# Patient Record
Sex: Female | Born: 1975 | Race: White | Hispanic: No | Marital: Married | Smoking: Never smoker
Health system: Southern US, Community
[De-identification: ages and names within clinical notes are randomized; demographics above are authoritative.]

## PROBLEM LIST (undated history)

## (undated) DIAGNOSIS — K900 Celiac disease: Secondary | ICD-10-CM

## (undated) HISTORY — PX: NO PAST SURGERIES: SHX2092

---

## 2010-04-29 ENCOUNTER — Inpatient Hospital Stay (HOSPITAL_COMMUNITY): Admission: EM | Admit: 2010-04-29 | Discharge: 2010-05-01 | Payer: Self-pay | Admitting: Emergency Medicine

## 2010-04-29 ENCOUNTER — Ambulatory Visit: Payer: Self-pay | Admitting: Infectious Diseases

## 2010-11-06 LAB — URINE MICROSCOPIC-ADD ON

## 2010-11-06 LAB — CLOSTRIDIUM DIFFICILE EIA

## 2010-11-06 LAB — COMPREHENSIVE METABOLIC PANEL
ALT: 18 U/L (ref 0–35)
AST: 17 U/L (ref 0–37)
Alkaline Phosphatase: 49 U/L (ref 39–117)
CO2: 26 mEq/L (ref 19–32)
Calcium: 8 mg/dL — ABNORMAL LOW (ref 8.4–10.5)
Chloride: 107 mEq/L (ref 96–112)
GFR calc Af Amer: >60 mL/min (ref >60)
GFR calc non Af Amer: >60 mL/min (ref >60)
Glucose, Bld: 103 mg/dL — ABNORMAL HIGH (ref 70–99)
Potassium: 3.8 mEq/L (ref 3.5–5.1)
Sodium: 136 mEq/L (ref 135–145)
Total Bilirubin: 0.2 mg/dL — ABNORMAL LOW (ref 0.3–1.2)

## 2010-11-06 LAB — BASIC METABOLIC PANEL
BUN: 3 mg/dL — ABNORMAL LOW (ref 6–23)
CO2: 25 mEq/L (ref 19–32)
Calcium: 7.9 mg/dL — ABNORMAL LOW (ref 8.4–10.5)
Chloride: 107 mEq/L (ref 96–112)
Chloride: 108 mEq/L (ref 96–112)
Creatinine, Ser: 0.92 mg/dL (ref 0.4–1.2)
Creatinine, Ser: 1.04 mg/dL (ref 0.4–1.2)
GFR calc Af Amer: >60 mL/min (ref >60)
Sodium: 137 mEq/L (ref 135–145)

## 2010-11-06 LAB — DIFFERENTIAL
Basophils Relative: 0 % (ref 0–1)
Eosinophils Absolute: 0 10*3/uL (ref 0.0–0.7)
Eosinophils Relative: 0 % (ref 0–5)
Lymphocytes Relative: 18 % (ref 12–46)
Lymphs Abs: 0.5 10*3/uL — ABNORMAL LOW (ref 0.7–4.0)
Lymphs Abs: 1 10*3/uL (ref 0.7–4.0)
Neutrophils Relative %: 74 % (ref 43–77)

## 2010-11-06 LAB — GRAM STAIN: Gram Stain: NONE SEEN

## 2010-11-06 LAB — CBC
HCT: 36.7 % (ref 36.0–46.0)
Hemoglobin: 12.7 g/dL (ref 12.0–15.0)
MCH: 31.2 pg (ref 26.0–34.0)
MCV: 90 fL (ref 78.0–100.0)
MCV: 91.2 fL (ref 78.0–100.0)
Platelets: 138 10*3/uL — ABNORMAL LOW (ref 150–400)
Platelets: 144 10*3/uL — ABNORMAL LOW (ref 150–400)
RBC: 3.91 MIL/uL (ref 3.87–5.11)
RBC: 3.98 MIL/uL (ref 3.87–5.11)
RBC: 4.04 MIL/uL (ref 3.87–5.11)
RDW: 12.1 % (ref 11.5–15.5)
WBC: 5.7 10*3/uL (ref 4.0–10.5)

## 2010-11-06 LAB — URINALYSIS, ROUTINE W REFLEX MICROSCOPIC
Glucose, UA: NEGATIVE mg/dL
Ketones, ur: 40 mg/dL — AB
Protein, ur: 30 mg/dL — AB
pH: 6 (ref 5.0–8.0)

## 2010-11-06 LAB — STOOL CULTURE

## 2010-11-06 LAB — LIPASE, BLOOD: Lipase: 26 U/L (ref 11–59)

## 2011-04-13 ENCOUNTER — Other Ambulatory Visit (HOSPITAL_COMMUNITY)
Admission: RE | Admit: 2011-04-13 | Discharge: 2011-04-13 | Disposition: A | Discharge status: Home / Self Care | Payer: Managed Care, Other (non HMO) | Source: Ambulatory Visit | Attending: Obstetrics and Gynecology | Admitting: Obstetrics and Gynecology

## 2011-04-13 DIAGNOSIS — Z01419 Encounter for gynecological examination (general) (routine) without abnormal findings: Secondary | ICD-10-CM | POA: Insufficient documentation

## 2012-04-13 ENCOUNTER — Other Ambulatory Visit (HOSPITAL_COMMUNITY)
Admission: RE | Admit: 2012-04-13 | Discharge: 2012-04-13 | Disposition: A | Discharge status: Home / Self Care | Payer: Managed Care, Other (non HMO) | Source: Ambulatory Visit | Attending: Obstetrics and Gynecology | Admitting: Obstetrics and Gynecology

## 2012-04-13 DIAGNOSIS — Z1151 Encounter for screening for human papillomavirus (HPV): Secondary | ICD-10-CM | POA: Insufficient documentation

## 2012-04-13 DIAGNOSIS — Z01419 Encounter for gynecological examination (general) (routine) without abnormal findings: Secondary | ICD-10-CM | POA: Insufficient documentation

## 2014-04-18 ENCOUNTER — Other Ambulatory Visit (HOSPITAL_COMMUNITY)
Admission: RE | Admit: 2014-04-18 | Discharge: 2014-04-18 | Disposition: A | Discharge status: Home / Self Care | Payer: Managed Care, Other (non HMO) | Source: Ambulatory Visit | Attending: Obstetrics & Gynecology | Admitting: Obstetrics & Gynecology

## 2014-04-18 ENCOUNTER — Other Ambulatory Visit: Payer: Self-pay | Admitting: Obstetrics & Gynecology

## 2014-04-18 DIAGNOSIS — Z1151 Encounter for screening for human papillomavirus (HPV): Secondary | ICD-10-CM | POA: Diagnosis present

## 2014-04-18 DIAGNOSIS — Z01419 Encounter for gynecological examination (general) (routine) without abnormal findings: Secondary | ICD-10-CM | POA: Diagnosis present

## 2014-04-20 LAB — CYTOLOGY - PAP

## 2014-11-14 ENCOUNTER — Other Ambulatory Visit: Payer: Self-pay

## 2014-11-14 DIAGNOSIS — Z1231 Encounter for screening mammogram for malignant neoplasm of breast: Secondary | ICD-10-CM

## 2014-12-11 ENCOUNTER — Ambulatory Visit
Admission: RE | Admit: 2014-12-11 | Discharge: 2014-12-11 | Disposition: A | Discharge status: Home / Self Care | Payer: BLUE CROSS/BLUE SHIELD | Source: Ambulatory Visit

## 2014-12-11 DIAGNOSIS — Z1231 Encounter for screening mammogram for malignant neoplasm of breast: Secondary | ICD-10-CM

## 2016-10-14 DIAGNOSIS — N390 Urinary tract infection, site not specified: Secondary | ICD-10-CM | POA: Diagnosis not present

## 2016-11-18 DIAGNOSIS — M9903 Segmental and somatic dysfunction of lumbar region: Secondary | ICD-10-CM | POA: Diagnosis not present

## 2016-11-18 DIAGNOSIS — M9901 Segmental and somatic dysfunction of cervical region: Secondary | ICD-10-CM | POA: Diagnosis not present

## 2016-11-18 DIAGNOSIS — R293 Abnormal posture: Secondary | ICD-10-CM | POA: Diagnosis not present

## 2016-11-18 DIAGNOSIS — M791 Myalgia: Secondary | ICD-10-CM | POA: Diagnosis not present

## 2017-04-27 ENCOUNTER — Other Ambulatory Visit (HOSPITAL_COMMUNITY)
Admission: RE | Admit: 2017-04-27 | Discharge: 2017-04-27 | Disposition: A | Discharge status: Home / Self Care | Payer: BLUE CROSS/BLUE SHIELD | Source: Ambulatory Visit | Attending: Obstetrics and Gynecology | Admitting: Obstetrics and Gynecology

## 2017-04-27 ENCOUNTER — Other Ambulatory Visit: Payer: Self-pay | Admitting: Obstetrics and Gynecology

## 2017-04-27 DIAGNOSIS — Z124 Encounter for screening for malignant neoplasm of cervix: Secondary | ICD-10-CM | POA: Diagnosis not present

## 2017-04-27 DIAGNOSIS — Z01411 Encounter for gynecological examination (general) (routine) with abnormal findings: Secondary | ICD-10-CM | POA: Diagnosis not present

## 2017-04-27 DIAGNOSIS — Z30431 Encounter for routine checking of intrauterine contraceptive device: Secondary | ICD-10-CM | POA: Diagnosis not present

## 2017-04-29 LAB — CYTOLOGY - PAP
Diagnosis: NEGATIVE
HPV (WINDOPATH): NOT DETECTED

## 2017-05-11 DIAGNOSIS — Z30433 Encounter for removal and reinsertion of intrauterine contraceptive device: Secondary | ICD-10-CM | POA: Diagnosis not present

## 2017-06-22 DIAGNOSIS — Z30431 Encounter for routine checking of intrauterine contraceptive device: Secondary | ICD-10-CM | POA: Diagnosis not present

## 2017-11-19 ENCOUNTER — Other Ambulatory Visit: Payer: Self-pay | Admitting: Obstetrics and Gynecology

## 2017-11-19 DIAGNOSIS — Z1231 Encounter for screening mammogram for malignant neoplasm of breast: Secondary | ICD-10-CM

## 2017-12-10 ENCOUNTER — Ambulatory Visit
Admission: RE | Admit: 2017-12-10 | Discharge: 2017-12-10 | Disposition: A | Discharge status: Home / Self Care | Payer: BLUE CROSS/BLUE SHIELD | Source: Ambulatory Visit | Attending: Obstetrics and Gynecology | Admitting: Obstetrics and Gynecology

## 2017-12-10 DIAGNOSIS — Z1231 Encounter for screening mammogram for malignant neoplasm of breast: Secondary | ICD-10-CM | POA: Diagnosis not present

## 2018-05-25 DIAGNOSIS — H6092 Unspecified otitis externa, left ear: Secondary | ICD-10-CM | POA: Diagnosis not present

## 2018-05-27 DIAGNOSIS — Z01419 Encounter for gynecological examination (general) (routine) without abnormal findings: Secondary | ICD-10-CM | POA: Diagnosis not present

## 2019-05-15 DIAGNOSIS — R197 Diarrhea, unspecified: Secondary | ICD-10-CM | POA: Diagnosis not present

## 2019-05-19 DIAGNOSIS — R197 Diarrhea, unspecified: Secondary | ICD-10-CM | POA: Diagnosis not present

## 2019-05-19 DIAGNOSIS — Z23 Encounter for immunization: Secondary | ICD-10-CM | POA: Diagnosis not present

## 2019-05-31 DIAGNOSIS — Z01419 Encounter for gynecological examination (general) (routine) without abnormal findings: Secondary | ICD-10-CM | POA: Diagnosis not present

## 2019-07-05 DIAGNOSIS — K529 Noninfective gastroenteritis and colitis, unspecified: Secondary | ICD-10-CM | POA: Diagnosis not present

## 2019-07-05 DIAGNOSIS — R195 Other fecal abnormalities: Secondary | ICD-10-CM | POA: Diagnosis not present

## 2019-07-05 DIAGNOSIS — R198 Other specified symptoms and signs involving the digestive system and abdomen: Secondary | ICD-10-CM | POA: Diagnosis not present

## 2019-07-07 DIAGNOSIS — K529 Noninfective gastroenteritis and colitis, unspecified: Secondary | ICD-10-CM | POA: Diagnosis not present

## 2019-07-10 DIAGNOSIS — K529 Noninfective gastroenteritis and colitis, unspecified: Secondary | ICD-10-CM | POA: Diagnosis not present

## 2019-07-25 DIAGNOSIS — Z1159 Encounter for screening for other viral diseases: Secondary | ICD-10-CM | POA: Diagnosis not present

## 2019-07-28 DIAGNOSIS — R768 Other specified abnormal immunological findings in serum: Secondary | ICD-10-CM | POA: Diagnosis not present

## 2019-07-28 DIAGNOSIS — K900 Celiac disease: Secondary | ICD-10-CM | POA: Diagnosis not present

## 2019-07-28 DIAGNOSIS — K3189 Other diseases of stomach and duodenum: Secondary | ICD-10-CM | POA: Diagnosis not present

## 2019-07-28 DIAGNOSIS — R194 Change in bowel habit: Secondary | ICD-10-CM | POA: Diagnosis not present

## 2019-07-28 DIAGNOSIS — K2930 Chronic superficial gastritis without bleeding: Secondary | ICD-10-CM | POA: Diagnosis not present

## 2019-07-28 DIAGNOSIS — R197 Diarrhea, unspecified: Secondary | ICD-10-CM | POA: Diagnosis not present

## 2019-08-24 DIAGNOSIS — K900 Celiac disease: Secondary | ICD-10-CM | POA: Diagnosis not present

## 2019-08-25 DIAGNOSIS — Z20828 Contact with and (suspected) exposure to other viral communicable diseases: Secondary | ICD-10-CM | POA: Diagnosis not present

## 2019-08-31 DIAGNOSIS — K900 Celiac disease: Secondary | ICD-10-CM | POA: Diagnosis not present

## 2019-09-04 ENCOUNTER — Encounter: Payer: BC Managed Care – PPO | Attending: Gastroenterology | Admitting: Registered"

## 2019-09-04 ENCOUNTER — Other Ambulatory Visit: Payer: Self-pay

## 2019-09-04 ENCOUNTER — Encounter: Payer: Self-pay | Admitting: Registered"

## 2019-09-04 DIAGNOSIS — Z713 Dietary counseling and surveillance: Secondary | ICD-10-CM

## 2019-09-04 NOTE — Progress Notes (Signed)
  Medical Nutrition Therapy:  Appt start time: 0815 end time:  0845.   Assessment:  Primary concerns today:  Patient states she was recently diagnoses with Celiac Disease. Pt reports since diagnosis she has been able to cut out gluten and CD related symptoms have improved. Pt states her mother is a retired Data processing manager and has helped her. The main reason for visit is her concern of cross-contamination.  Pt reports resources used include Gluten-free scanner app and Celiac [employer].  BM: diarrhea has resolved on GF diet. Sleep: 7-8 hrs. Sleeps ~11:30p-7a; wakes up refreshed.  Seasonally has post nasal drip with sore throat in morning.  Learning Readiness:   Change in progress   MEDICATIONS: reviewed   DIETARY INTAKE:  Usual eating pattern includes 3 meals and 2 snacks per day.  Avoided foods include gluten.    24-hr recall:  B (11 AM): M-F 30 gram pro powder, collagen, 1/2 c GF oatmeal Snk ( AM): none  L ( PM): M-F 4 oz chicken, honey mustard Snk ( PM): apple & PB OR other fruit D ( PM): Pro (fish, chicken, scallops), vegetables, starch OR soups Snk ( PM): chocolate OR protein bar Beverages: water, hot tea, almond milk with breakfast, OJ on weekends  Usual physical activity: 3-5x week running 3-5 miles; weight lifting (has a full gym at home)  Estimated energy needs: ~1800 kcal/day  Progress Towards Goal(s):  In progress.   Nutritional Diagnosis:  NB-1.1 Food and nutrition-related knowledge deficit As related to gluten cross-contamination .  As evidenced by Pt reported desire for increased knowledge.    Intervention:  Nutrition Education. Discussed when cross-contamination may be a concern with home cooking, shopping for products, and dining out.  Teaching Method Utilized: Visual Auditory  Handouts given during visit include:  none  Barriers to learning/adherence to lifestyle change: none  Demonstrated degree of understanding via:  Teach Back    Monitoring/Evaluation:  Dietary intake, exercise, CD flares, and body weight prn.

## 2019-09-04 NOTE — Patient Instructions (Addendum)
To address your cross-contamination concerns: Get your own [employer]  storage containers (if plastic) At restaurants ask if they separate GF foods

## 2019-10-28 ENCOUNTER — Encounter (HOSPITAL_COMMUNITY): Payer: Self-pay | Admitting: *Deleted

## 2019-10-28 ENCOUNTER — Ambulatory Visit (HOSPITAL_COMMUNITY)
Admission: EM | Admit: 2019-10-28 | Discharge: 2019-10-28 | Disposition: A | Discharge status: Home / Self Care | Payer: BC Managed Care – PPO

## 2019-10-28 ENCOUNTER — Other Ambulatory Visit: Payer: Self-pay

## 2019-10-28 DIAGNOSIS — M79675 Pain in left toe(s): Secondary | ICD-10-CM

## 2019-10-28 DIAGNOSIS — Z23 Encounter for immunization: Secondary | ICD-10-CM | POA: Diagnosis not present

## 2019-10-28 DIAGNOSIS — W293XXA Contact with powered garden and outdoor hand tools and machinery, initial encounter: Secondary | ICD-10-CM

## 2019-10-28 DIAGNOSIS — S91212A Laceration without foreign body of left great toe with damage to nail, initial encounter: Secondary | ICD-10-CM | POA: Diagnosis not present

## 2019-10-28 HISTORY — DX: Celiac disease: K90.0

## 2019-10-28 MED ORDER — TETANUS-DIPHTH-ACELL PERTUSSIS 5-2.5-18.5 LF-MCG/0.5 IM SUSP
0.5000 mL | Freq: Once | INTRAMUSCULAR | Status: AC
  Administered 2019-10-28: 0.5 mL via INTRAMUSCULAR

## 2019-10-28 MED ORDER — SULFAMETHOXAZOLE-TRIMETHOPRIM 800-160 MG PO TABS: 1.0000 | ORAL_TABLET | Freq: Two times a day (BID) | ORAL | 0 refills | Status: AC

## 2019-10-28 MED ORDER — IBUPROFEN 800 MG PO TABS: 800.0000 mg | ORAL_TABLET | Freq: Three times a day (TID) | ORAL | 0 refills | Status: AC | PRN

## 2019-10-28 MED ORDER — TETANUS-DIPHTH-ACELL PERTUSSIS 5-2.5-18.5 LF-MCG/0.5 IM SUSP
INTRAMUSCULAR | Status: AC
  Filled 2019-10-28: qty 0.5

## 2019-10-28 MED ORDER — TRAMADOL HCL 50 MG PO TABS: 50.0000 mg | ORAL_TABLET | Freq: Four times a day (QID) | ORAL | 0 refills | Status: DC | PRN

## 2019-10-28 NOTE — ED Triage Notes (Signed)
Reports using chainsaw to cut log when it got stuck and pulled out, cutting through left shoe.  Macerated wound to left great toe nailbed with superficial-appearing laceration to medial left great toe extending some into distal foot.  LLE CMS intact.  Bleeding controlled.

## 2019-10-28 NOTE — ED Notes (Signed)
Staff did an assessment on patient foot, pt is able to move her left foot. There is no numbness or tingling just throbbing. There is no white meat hanging out. The chainsaw cut her show first then skin

## 2019-10-28 NOTE — ED Provider Notes (Signed)
[place]    CSN: [id number] Arrival date & time: 10/28/19  1241      History   Chief Complaint Chief Complaint  Patient presents with  . Extremity Laceration    HPI Christine Escobar is a 44 y.o. female.   Patient reports that she was using a chainsaw earlier today, and that the chainsaw slipped on a knot in the wood that she was cutting.  Then the chainsaw caught her left foot.  Reports that she can move her toes, but that it is painful.  Reports that she was wearing shoes and that it cut through her shoe and sock.  Has not attempted any treatments.  Does not recall when last tetanus vaccine was.  Denies fever, cough, headache, radiating pain anywhere, nausea, vomiting, diarrhea, chills, body aches, rash, other symptoms.     The history is provided by the patient.    Past Medical History:  Diagnosis Date  . Celiac disease     There are no problems to display for this patient.   History reviewed. No pertinent surgical history.  OB History   No obstetric history on file.      Home Medications    Prior to Admission medications   Medication Sig Start Date End Date Taking? Authorizing Provider  Glucos-Chondroit-Collag-Hyal (GLUCOSAMINE CHONDROIT-COLLAGEN PO) Take by mouth.   Yes [provider]  Multiple Vitamins-Minerals (MULTIVITAMIN ADULT PO) Take by mouth.   Yes [provider]  Probiotic Product (PROBIOTIC PO) Take by mouth.   Yes [provider]  VITAMIN D PO Take by mouth.   Yes [provider]  ibuprofen (ADVIL) 800 MG tablet Take 1 tablet (800 mg total) by mouth every 8 (eight) hours as needed for moderate pain. 10/28/19   Faustino Congress, NP  loratadine (CLARITIN) 10 MG tablet Take 10 mg by mouth daily.    [provider]  sulfamethoxazole-trimethoprim (BACTRIM DS) 800-160 MG tablet Take 1 tablet by mouth 2 (two) times daily for 7 days. 10/28/19 11/04/19  Faustino Congress, NP  traMADol (ULTRAM) 50 MG  tablet Take 1 tablet (50 mg total) by mouth every 6 (six) hours as needed. 10/28/19   Faustino Congress, NP    Family History Family History  Problem Relation Age of Onset  . Hypertension Father     Social History Social History   Tobacco Use  . Smoking status: Never Smoker  . Smokeless tobacco: Never Used  Substance Use Topics  . Alcohol use: Yes    Comment: rarely  . Drug use: Never     Allergies   Patient has no known allergies.   Review of Systems Review of Systems   Physical Exam Triage Vital Signs ED Triage Vitals  Enc Vitals Group     BP 10/28/19 1319 112/73     Pulse Rate 10/28/19 1319 87     Resp 10/28/19 1319 16     Temp 10/28/19 1319 98.2 F (36.8 C)     Temp Source 10/28/19 1319 Oral     SpO2 10/28/19 1319 97 %     Weight --      Height --      Head Circumference --      Peak Flow --      Pain Score 10/28/19 1322 8     Pain Loc --      Pain Edu? --      Excl. in [place]? --    No data found.  Updated Vital Signs BP 112/73  Pulse 87   Temp 98.2 F (36.8 C) (Oral)   Resp 16   SpO2 97%      Physical Exam Vitals and nursing note reviewed.  Constitutional:      General: She is not in acute distress.    Appearance: Normal appearance. She is well-developed and normal weight.  HENT:     Head: Normocephalic and atraumatic.  Eyes:     Conjunctiva/sclera: Conjunctivae normal.  Cardiovascular:     Rate and Rhythm: Normal rate and regular rhythm.     Heart sounds: Normal heart sounds. No murmur.  Pulmonary:     Effort: Pulmonary effort is normal. No respiratory distress.     Breath sounds: Normal breath sounds. No stridor. No wheezing, rhonchi or rales.  Abdominal:     General: Bowel sounds are normal.     Palpations: Abdomen is soft.     Tenderness: There is no abdominal tenderness.  Musculoskeletal:        General: Signs of injury present.     Cervical back: Neck supple.     Left foot: Laceration present.       Legs:     Comments: 1  inch lac to great toe.  Laceration to toenail as well, no wound edges to approximate.    Skin:    General: Skin is warm and dry.     Capillary Refill: Capillary refill takes less than 2 seconds.  Neurological:     General: No focal deficit present.     Mental Status: She is alert and oriented to person, place, and time.  Psychiatric:        Mood and Affect: Mood normal.        Behavior: Behavior normal.      UC Treatments / Results  Labs (all labs ordered are listed, but only abnormal results are displayed) Labs Reviewed - No data to display  EKG   Radiology No results found.  Procedures Laceration Repair  Date/Time: 10/28/2019 3:54 PM Performed by: Moshe Cipro, NP Authorized by: Moshe Cipro, NP   Consent:    Consent obtained:  Verbal   Consent given by:  Patient   Risks discussed:  Infection and pain Universal protocol:    Patient identity confirmed:  Verbally with patient Anesthesia (see MAR for exact dosages):    Anesthesia method:  Local infiltration   Local anesthetic:  Lidocaine 2% w/o epi Laceration details:    Location:  Toe   Toe location:  L big toe   Length (cm):  1   Depth (mm):  1 Repair type:    Repair type:  Simple Exploration:    Wound exploration: entire depth of wound probed and visualized     Contaminated: no   Treatment:    Area cleansed with:  Betadine   Amount of cleaning:  Standard   Irrigation solution:  Sterile saline   Irrigation volume:  20 mm   Irrigation method:  Syringe   Visualized foreign bodies/material removed: yes   Skin repair:    Repair method:  Tissue adhesive Approximation:    Approximation:  Close Post-procedure details:    Dressing:  Non-adherent dressing   Patient tolerance of procedure:  Tolerated well, no immediate complications Comments:     1 inch laceration to left great toe closed with skin glue.  Laceration to toenail did not have edges to approximate.  Most of left great toenail was  removed.  Nonadherent dressing placed with pressure dressing above wrapped in [place].   (  including critical care time)  Medications Ordered in UC Medications  Tdap (BOOSTRIX) injection 0.5 mL (0.5 mLs Intramuscular Given 10/28/19 1410)    Initial Impression / Assessment and Plan / UC Course  I have reviewed the triage vital signs and the nursing notes.  Pertinent labs & imaging results that were available during my care of the patient were reviewed by me and considered in my medical decision making (see chart for details).     Laceration to great toe no foreign body, pain of left great toe and foot.  Chainsaw injury to left great toe.  Laceration repaired in office.  Unable to repair toenail, pressure dressing applied.  Prescribed ibuprofen 800 mg as needed every 8 hours for pain and inflammation also prescribed tramadol 50 mg 1 tablet by mouth every 6 hours as needed for pain also prescribed Bactrim 1 tablet twice a day x7 days.  Instructed to follow-up with primary care or this office if noticing any signs of infection. Final Clinical Impressions(s) / UC Diagnoses   Final diagnoses:  Laceration of left great toe without foreign body with damage to nail, initial encounter  Pain of left great toe     Discharge Instructions     Take the ibuprofen as prescribed.  Rest and elevate your foot.  Apply ice packs 2-3 times a day for up to 20 minutes each.  Wear the Ace wrap as needed for comfort.    Follow up with your primary care provider or an orthopedist if you symptoms continue or worsen;  Or if you develop new symptoms, such as numbness, tingling, or weakness.       ED Prescriptions    Medication Sig Dispense Auth. Provider   ibuprofen (ADVIL) 800 MG tablet Take 1 tablet (800 mg total) by mouth every 8 (eight) hours as needed for moderate pain. 21 tablet Moshe Cipro, NP   traMADol (ULTRAM) 50 MG tablet Take 1 tablet (50 mg total) by mouth every 6 (six) hours as needed. 15  tablet Moshe Cipro, NP   sulfamethoxazole-trimethoprim (BACTRIM DS) 800-160 MG tablet Take 1 tablet by mouth 2 (two) times daily for 7 days. 14 tablet Moshe Cipro, NP     I have reviewed the PDMP during this encounter.   Moshe Cipro, NP 10/28/19 1558

## 2019-10-28 NOTE — Discharge Instructions (Signed)
Take the ibuprofen as prescribed.  Rest and elevate your foot.  Apply ice packs 2-3 times a day for up to 20 minutes each.  Wear the Ace wrap as needed for comfort.   ° °Follow up with your primary care provider or an orthopedist if you symptoms continue or worsen;  Or if you develop new symptoms, such as numbness, tingling, or weakness.   ° ° ° °

## 2020-04-24 DIAGNOSIS — K900 Celiac disease: Secondary | ICD-10-CM | POA: Diagnosis not present

## 2021-01-14 DIAGNOSIS — Z01419 Encounter for gynecological examination (general) (routine) without abnormal findings: Secondary | ICD-10-CM | POA: Diagnosis not present

## 2021-03-20 ENCOUNTER — Other Ambulatory Visit: Payer: Self-pay | Admitting: Nurse Practitioner

## 2021-03-20 ENCOUNTER — Other Ambulatory Visit: Payer: Self-pay | Admitting: *Deleted

## 2021-03-20 DIAGNOSIS — Z1231 Encounter for screening mammogram for malignant neoplasm of breast: Secondary | ICD-10-CM

## 2021-03-22 ENCOUNTER — Ambulatory Visit
Admission: RE | Admit: 2021-03-22 | Discharge: 2021-03-22 | Disposition: A | Discharge status: Home / Self Care | Payer: BC Managed Care – PPO | Source: Ambulatory Visit | Attending: Nurse Practitioner | Admitting: Nurse Practitioner

## 2021-03-22 DIAGNOSIS — Z1231 Encounter for screening mammogram for malignant neoplasm of breast: Secondary | ICD-10-CM

## 2021-12-15 DIAGNOSIS — K900 Celiac disease: Secondary | ICD-10-CM | POA: Diagnosis not present

## 2021-12-31 DIAGNOSIS — M5388 Other specified dorsopathies, sacral and sacrococcygeal region: Secondary | ICD-10-CM | POA: Diagnosis not present

## 2021-12-31 DIAGNOSIS — G44209 Tension-type headache, unspecified, not intractable: Secondary | ICD-10-CM | POA: Diagnosis not present

## 2021-12-31 DIAGNOSIS — M609 Myositis, unspecified: Secondary | ICD-10-CM | POA: Diagnosis not present

## 2021-12-31 DIAGNOSIS — M47812 Spondylosis without myelopathy or radiculopathy, cervical region: Secondary | ICD-10-CM | POA: Diagnosis not present

## 2022-01-01 DIAGNOSIS — M609 Myositis, unspecified: Secondary | ICD-10-CM | POA: Diagnosis not present

## 2022-01-01 DIAGNOSIS — G44209 Tension-type headache, unspecified, not intractable: Secondary | ICD-10-CM | POA: Diagnosis not present

## 2022-01-01 DIAGNOSIS — M5388 Other specified dorsopathies, sacral and sacrococcygeal region: Secondary | ICD-10-CM | POA: Diagnosis not present

## 2022-01-01 DIAGNOSIS — M47812 Spondylosis without myelopathy or radiculopathy, cervical region: Secondary | ICD-10-CM | POA: Diagnosis not present

## 2022-01-05 DIAGNOSIS — G44209 Tension-type headache, unspecified, not intractable: Secondary | ICD-10-CM | POA: Diagnosis not present

## 2022-01-05 DIAGNOSIS — M5388 Other specified dorsopathies, sacral and sacrococcygeal region: Secondary | ICD-10-CM | POA: Diagnosis not present

## 2022-01-05 DIAGNOSIS — M47812 Spondylosis without myelopathy or radiculopathy, cervical region: Secondary | ICD-10-CM | POA: Diagnosis not present

## 2022-01-05 DIAGNOSIS — M609 Myositis, unspecified: Secondary | ICD-10-CM | POA: Diagnosis not present

## 2022-01-07 DIAGNOSIS — G44209 Tension-type headache, unspecified, not intractable: Secondary | ICD-10-CM | POA: Diagnosis not present

## 2022-01-07 DIAGNOSIS — M47812 Spondylosis without myelopathy or radiculopathy, cervical region: Secondary | ICD-10-CM | POA: Diagnosis not present

## 2022-01-07 DIAGNOSIS — M5388 Other specified dorsopathies, sacral and sacrococcygeal region: Secondary | ICD-10-CM | POA: Diagnosis not present

## 2022-01-07 DIAGNOSIS — M609 Myositis, unspecified: Secondary | ICD-10-CM | POA: Diagnosis not present

## 2022-01-12 DIAGNOSIS — M609 Myositis, unspecified: Secondary | ICD-10-CM | POA: Diagnosis not present

## 2022-01-12 DIAGNOSIS — G44209 Tension-type headache, unspecified, not intractable: Secondary | ICD-10-CM | POA: Diagnosis not present

## 2022-01-12 DIAGNOSIS — M47812 Spondylosis without myelopathy or radiculopathy, cervical region: Secondary | ICD-10-CM | POA: Diagnosis not present

## 2022-01-12 DIAGNOSIS — M5388 Other specified dorsopathies, sacral and sacrococcygeal region: Secondary | ICD-10-CM | POA: Diagnosis not present

## 2022-01-15 ENCOUNTER — Other Ambulatory Visit: Payer: Self-pay | Admitting: Nurse Practitioner

## 2022-01-15 ENCOUNTER — Other Ambulatory Visit (HOSPITAL_COMMUNITY)
Admission: RE | Admit: 2022-01-15 | Discharge: 2022-01-15 | Disposition: A | Discharge status: Home / Self Care | Payer: BC Managed Care – PPO | Source: Ambulatory Visit | Attending: Nurse Practitioner | Admitting: Nurse Practitioner

## 2022-01-15 DIAGNOSIS — G44209 Tension-type headache, unspecified, not intractable: Secondary | ICD-10-CM | POA: Diagnosis not present

## 2022-01-15 DIAGNOSIS — Z124 Encounter for screening for malignant neoplasm of cervix: Secondary | ICD-10-CM | POA: Diagnosis not present

## 2022-01-15 DIAGNOSIS — M5388 Other specified dorsopathies, sacral and sacrococcygeal region: Secondary | ICD-10-CM | POA: Diagnosis not present

## 2022-01-15 DIAGNOSIS — M47812 Spondylosis without myelopathy or radiculopathy, cervical region: Secondary | ICD-10-CM | POA: Diagnosis not present

## 2022-01-15 DIAGNOSIS — M609 Myositis, unspecified: Secondary | ICD-10-CM | POA: Diagnosis not present

## 2022-01-15 DIAGNOSIS — Z01419 Encounter for gynecological examination (general) (routine) without abnormal findings: Secondary | ICD-10-CM | POA: Diagnosis not present

## 2022-01-21 DIAGNOSIS — M47812 Spondylosis without myelopathy or radiculopathy, cervical region: Secondary | ICD-10-CM | POA: Diagnosis not present

## 2022-01-21 DIAGNOSIS — G44209 Tension-type headache, unspecified, not intractable: Secondary | ICD-10-CM | POA: Diagnosis not present

## 2022-01-21 DIAGNOSIS — M5388 Other specified dorsopathies, sacral and sacrococcygeal region: Secondary | ICD-10-CM | POA: Diagnosis not present

## 2022-01-21 DIAGNOSIS — M609 Myositis, unspecified: Secondary | ICD-10-CM | POA: Diagnosis not present

## 2022-01-22 LAB — CYTOLOGY - PAP
Comment: NEGATIVE
Diagnosis: NEGATIVE
High risk HPV: NEGATIVE

## 2022-01-28 DIAGNOSIS — M5388 Other specified dorsopathies, sacral and sacrococcygeal region: Secondary | ICD-10-CM | POA: Diagnosis not present

## 2022-01-28 DIAGNOSIS — M47812 Spondylosis without myelopathy or radiculopathy, cervical region: Secondary | ICD-10-CM | POA: Diagnosis not present

## 2022-01-28 DIAGNOSIS — M609 Myositis, unspecified: Secondary | ICD-10-CM | POA: Diagnosis not present

## 2022-01-28 DIAGNOSIS — G44209 Tension-type headache, unspecified, not intractable: Secondary | ICD-10-CM | POA: Diagnosis not present

## 2022-02-04 DIAGNOSIS — G44209 Tension-type headache, unspecified, not intractable: Secondary | ICD-10-CM | POA: Diagnosis not present

## 2022-02-04 DIAGNOSIS — M5388 Other specified dorsopathies, sacral and sacrococcygeal region: Secondary | ICD-10-CM | POA: Diagnosis not present

## 2022-02-04 DIAGNOSIS — M47812 Spondylosis without myelopathy or radiculopathy, cervical region: Secondary | ICD-10-CM | POA: Diagnosis not present

## 2022-02-04 DIAGNOSIS — M609 Myositis, unspecified: Secondary | ICD-10-CM | POA: Diagnosis not present

## 2022-02-11 DIAGNOSIS — G44209 Tension-type headache, unspecified, not intractable: Secondary | ICD-10-CM | POA: Diagnosis not present

## 2022-02-11 DIAGNOSIS — M5388 Other specified dorsopathies, sacral and sacrococcygeal region: Secondary | ICD-10-CM | POA: Diagnosis not present

## 2022-02-11 DIAGNOSIS — M609 Myositis, unspecified: Secondary | ICD-10-CM | POA: Diagnosis not present

## 2022-02-11 DIAGNOSIS — M47812 Spondylosis without myelopathy or radiculopathy, cervical region: Secondary | ICD-10-CM | POA: Diagnosis not present

## 2022-02-18 DIAGNOSIS — M609 Myositis, unspecified: Secondary | ICD-10-CM | POA: Diagnosis not present

## 2022-02-18 DIAGNOSIS — G44209 Tension-type headache, unspecified, not intractable: Secondary | ICD-10-CM | POA: Diagnosis not present

## 2022-02-18 DIAGNOSIS — M5388 Other specified dorsopathies, sacral and sacrococcygeal region: Secondary | ICD-10-CM | POA: Diagnosis not present

## 2022-02-18 DIAGNOSIS — M47812 Spondylosis without myelopathy or radiculopathy, cervical region: Secondary | ICD-10-CM | POA: Diagnosis not present

## 2022-03-04 DIAGNOSIS — M5388 Other specified dorsopathies, sacral and sacrococcygeal region: Secondary | ICD-10-CM | POA: Diagnosis not present

## 2022-03-04 DIAGNOSIS — M47812 Spondylosis without myelopathy or radiculopathy, cervical region: Secondary | ICD-10-CM | POA: Diagnosis not present

## 2022-03-04 DIAGNOSIS — M609 Myositis, unspecified: Secondary | ICD-10-CM | POA: Diagnosis not present

## 2022-03-04 DIAGNOSIS — G44209 Tension-type headache, unspecified, not intractable: Secondary | ICD-10-CM | POA: Diagnosis not present

## 2022-03-18 DIAGNOSIS — M47812 Spondylosis without myelopathy or radiculopathy, cervical region: Secondary | ICD-10-CM | POA: Diagnosis not present

## 2022-03-18 DIAGNOSIS — M609 Myositis, unspecified: Secondary | ICD-10-CM | POA: Diagnosis not present

## 2022-03-18 DIAGNOSIS — M5388 Other specified dorsopathies, sacral and sacrococcygeal region: Secondary | ICD-10-CM | POA: Diagnosis not present

## 2022-03-18 DIAGNOSIS — G44209 Tension-type headache, unspecified, not intractable: Secondary | ICD-10-CM | POA: Diagnosis not present

## 2022-04-01 DIAGNOSIS — G44209 Tension-type headache, unspecified, not intractable: Secondary | ICD-10-CM | POA: Diagnosis not present

## 2022-04-01 DIAGNOSIS — M47812 Spondylosis without myelopathy or radiculopathy, cervical region: Secondary | ICD-10-CM | POA: Diagnosis not present

## 2022-04-01 DIAGNOSIS — M5388 Other specified dorsopathies, sacral and sacrococcygeal region: Secondary | ICD-10-CM | POA: Diagnosis not present

## 2022-04-01 DIAGNOSIS — M609 Myositis, unspecified: Secondary | ICD-10-CM | POA: Diagnosis not present

## 2022-05-13 ENCOUNTER — Other Ambulatory Visit: Payer: Self-pay | Admitting: Nurse Practitioner

## 2022-05-13 ENCOUNTER — Other Ambulatory Visit: Payer: Self-pay | Admitting: Physician Assistant

## 2022-05-13 DIAGNOSIS — Z1231 Encounter for screening mammogram for malignant neoplasm of breast: Secondary | ICD-10-CM

## 2022-06-12 ENCOUNTER — Ambulatory Visit
Admission: RE | Admit: 2022-06-12 | Discharge: 2022-06-12 | Disposition: A | Discharge status: Home / Self Care | Payer: BC Managed Care – PPO | Source: Ambulatory Visit | Attending: Physician Assistant | Admitting: Physician Assistant

## 2022-06-12 DIAGNOSIS — Z1231 Encounter for screening mammogram for malignant neoplasm of breast: Secondary | ICD-10-CM | POA: Diagnosis not present

## 2022-08-27 DIAGNOSIS — Z20828 Contact with and (suspected) exposure to other viral communicable diseases: Secondary | ICD-10-CM | POA: Diagnosis not present

## 2022-08-27 DIAGNOSIS — R0981 Nasal congestion: Secondary | ICD-10-CM | POA: Diagnosis not present

## 2022-08-27 DIAGNOSIS — R52 Pain, unspecified: Secondary | ICD-10-CM | POA: Diagnosis not present

## 2022-08-27 DIAGNOSIS — R059 Cough, unspecified: Secondary | ICD-10-CM | POA: Diagnosis not present

## 2022-10-29 DIAGNOSIS — L570 Actinic keratosis: Secondary | ICD-10-CM | POA: Diagnosis not present

## 2022-10-29 DIAGNOSIS — D2271 Melanocytic nevi of right lower limb, including hip: Secondary | ICD-10-CM | POA: Diagnosis not present

## 2022-10-29 DIAGNOSIS — D2262 Melanocytic nevi of left upper limb, including shoulder: Secondary | ICD-10-CM | POA: Diagnosis not present

## 2022-10-29 DIAGNOSIS — D2272 Melanocytic nevi of left lower limb, including hip: Secondary | ICD-10-CM | POA: Diagnosis not present

## 2022-10-29 DIAGNOSIS — D2261 Melanocytic nevi of right upper limb, including shoulder: Secondary | ICD-10-CM | POA: Diagnosis not present

## 2022-12-30 ENCOUNTER — Ambulatory Visit
Admission: RE | Admit: 2022-12-30 | Discharge: 2022-12-30 | Disposition: A | Discharge status: Home / Self Care | Payer: BC Managed Care – PPO | Source: Ambulatory Visit | Attending: Internal Medicine | Admitting: Internal Medicine

## 2022-12-30 ENCOUNTER — Ambulatory Visit (INDEPENDENT_AMBULATORY_CARE_PROVIDER_SITE_OTHER): Payer: BC Managed Care – PPO

## 2022-12-30 VITALS — BP 97/63 | HR 58 | Temp 97.6°F | Resp 18

## 2022-12-30 DIAGNOSIS — M79675 Pain in left toe(s): Secondary | ICD-10-CM

## 2022-12-30 DIAGNOSIS — S92502A Displaced unspecified fracture of left lesser toe(s), initial encounter for closed fracture: Secondary | ICD-10-CM | POA: Diagnosis not present

## 2022-12-30 DIAGNOSIS — M79672 Pain in left foot: Secondary | ICD-10-CM | POA: Diagnosis not present

## 2022-12-30 NOTE — Discharge Instructions (Addendum)
Your x-rays showed a broken bone in your left toe. They were sent to a radiologist for further evaluation and we will call you if the radiologist sees anything different.   I recommend you buddy tape the left toe and continue with a post op shoe either from home or our office. Make sure you contact Orthopedics for an urgent follow up to manage your fracture. I have attached the information for one here in town.   Recommend rest, ice, compression, and elevation. You can alternate Tylenol and NSAIDs (Ibuprofen, Alleve) as needed for pain if you are not allergic.   Return in 2 to 3 days if no improvement. It is very important for you to pay attention to any new symptoms or worsening of your current condition. Please go directly to the Emergency Department immediately should you begin to feel worse in any way or have any of the following symptoms: persistent fevers, increased pain, increased swelling, increased redness, or color changes.

## 2022-12-30 NOTE — ED Provider Notes (Signed)
[facility]  Note:  This document was prepared using Dragon voice recognition software and may include unintentional dictation errors.  MRN: [id number] DOB: [birth year] DATE: 12/30/22   Subjective:  Chief Complaint:  Chief Complaint  Patient presents with   Toe Pain    2nd toe on left foot started hurting last night,  no known injury occurred but it is now swollen and cannot bend and hurts when trying to walk. - Entered by patient     HPI: Christine Escobar is a 48 y.o. female presenting for pain in the left second toe for 1 day.  No known injury.  Patient states she was at her son's baseball game yesterday and when she got home noticed pain in the left second toe.  She states she was able to sleep through the night, but when she woke this morning the pain was worse.  Pain is worse with walking and weightbearing as well as flexion.  She states the pain is making it difficult for her to flex the left second toe and walk.  She wears flats or sneakers and has not worn any new shoes or heels.  She has been an active distant runner.  States her last run was on Saturday with no issues.  She took 2 over-the-counter Advils earlier this morning with little relief. Denies fever, numbness/tingling, nausea/vomiting. Endorses pain in toe. Presents NAD.  Prior to Admission medications   Medication Sig Start Date End Date Taking? Authorizing Provider  Glucos-Chondroit-Collag-Hyal (GLUCOSAMINE CHONDROIT-COLLAGEN PO) Take by mouth.    [provider]  ibuprofen (ADVIL) 800 MG tablet Take 1 tablet (800 mg total) by mouth every 8 (eight) hours as needed for moderate pain. 10/28/19   Moshe Cipro, NP  loratadine (CLARITIN) 10 MG tablet Take 10 mg by mouth daily.    [provider]  Multiple Vitamins-Minerals (MULTIVITAMIN ADULT PO) Take by mouth.    [provider]  Probiotic Product (PROBIOTIC PO) Take by mouth.    [provider]  VITAMIN D PO Take by mouth.     [provider]     No Known Allergies  History:   Past Medical History:  Diagnosis Date   Celiac disease      History reviewed. No pertinent surgical history.  Family History  Problem Relation Age of Onset   Hypertension Father     Social History   Tobacco Use   Smoking status: Never   Smokeless tobacco: Never  Vaping Use   Vaping Use: Never used  Substance Use Topics   Alcohol use: Yes    Comment: rarely   Drug use: Never    Review of Systems  Constitutional:  Negative for activity change and fever.  Gastrointestinal:  Negative for nausea and vomiting.  Musculoskeletal:  Positive for arthralgias.  Skin:  Negative for color change and wound.  Neurological:  Negative for numbness.     Objective:   Vitals: BP 97/63 (BP Location: Right Arm)   Pulse (!) 58 Comment: pt is a long distance runner, states this is normal for her  Temp 97.6 F (36.4 C) (Oral)   Resp 18   SpO2 98%   Physical Exam Constitutional:      General: She is not in acute distress.    Appearance: Normal appearance. She is well-developed and normal weight. She is not ill-appearing or toxic-appearing.  HENT:     Head: Normocephalic and atraumatic.  Cardiovascular:     Rate and Rhythm: Normal rate and regular  rhythm.     Pulses:          Dorsalis pedis pulses are 2+ on the right side and 2+ on the left side.     Heart sounds: Normal heart sounds.  Pulmonary:     Effort: Pulmonary effort is normal.     Breath sounds: Normal breath sounds.     Comments: Clear to auscultation bilaterally  Abdominal:     General: Bowel sounds are normal.     Palpations: Abdomen is soft.     Tenderness: There is no abdominal tenderness.  Musculoskeletal:     Right lower leg: No edema.     Left lower leg: No edema.     Right foot: Normal.     Left foot: Decreased range of motion. Tenderness present. Normal pulse.     Comments: Tenderness to palpation of left second toe both distal and proximal  phalanx.  Decreased range of motion of left second toe due to pain with plantarflexion, walking, weightbearing.  Neurovascular intact.  No warmth, erythema, discharge.  Feet:     Right foot:     Toenail Condition: Right toenails are normal.     Left foot:     Skin integrity: No erythema or warmth.     Toenail Condition: Left toenails are normal.  Skin:    General: Skin is warm and dry.  Neurological:     General: No focal deficit present.     Mental Status: She is alert.  Psychiatric:        Mood and Affect: Mood and affect normal.     Results:  Labs: No results found for this or any previous visit (from the past 24 hour(s)).  Radiology: DG Foot Complete Left  Result Date: 12/30/2022 CLINICAL DATA:  Left foot pain mainly involving the second toe. No known injury. EXAM: LEFT FOOT - COMPLETE 3+ VIEW COMPARISON:  None Available. FINDINGS: The joint spaces are maintained. No degenerative or erosive findings. On the lateral film there is a plantar plate avulsion fracture involving the middle phalanx of the second toe. IMPRESSION: Plantar plate avulsion fracture involving the middle phalanx of the second toe. Electronically Signed   By: Rudie Meyer M.D.   On: 12/30/2022 12:13     UC Course/Treatments:  Procedures: Procedures   Medications Ordered in UC: Medications - No data to display   Assessment and Plan :     ICD-10-CM   1. Closed fracture of phalanx of left second toe, initial encounter  S92.502A     2. Pain in toe of left foot  M79.675       Closed fracture of phalanx of left second toe: Afebrile, nontoxic-appearing, NAD. VSS. DDX includes but not limited to: Fracture, contusion, arthritis Imaging showed plantar plate avulsion fracture of the middle phalanx of the second toe.  She was placed in a postop shoe and the toe was buddy taped.  Recommend RICE regimen and over-the-counter analgesia as needed for pain.  She was instructed to follow-up with orthopedics for routine  healing. Strict ED precautions were given and patient verbalized understanding.   Pain in toe of left foot: Afebrile, nontoxic-appearing, NAD. VSS. DDX includes but not limited to: Fracture, contusion, arthritis Imaging showed plantar plate avulsion fracture of the middle phalanx of the second toe.  She was placed in a postop shoe and the toe was buddy taped.  Recommend RICE regimen and over-the-counter analgesia as needed for pain.  She was instructed to follow-up with orthopedics for  routine healing. Strict ED precautions were given and patient verbalized understanding.   ED Discharge Orders     None        PDMP not reviewed this encounter.     Rajanee Schuelke P, PA-C 12/30/22 1231

## 2022-12-30 NOTE — ED Triage Notes (Signed)
Pt c/o left second toe pain started hurting last night, no known injury. C/o pain, swollen and redness at joint, hurts to bend and with walking. Home remedies: Advil

## 2022-12-31 DIAGNOSIS — S93602A Unspecified sprain of left foot, initial encounter: Secondary | ICD-10-CM | POA: Diagnosis not present

## 2022-12-31 DIAGNOSIS — S92505A Nondisplaced unspecified fracture of left lesser toe(s), initial encounter for closed fracture: Secondary | ICD-10-CM | POA: Diagnosis not present

## 2023-01-15 IMAGING — MG MM DIGITAL SCREENING BILAT W/ TOMO AND CAD
8 series · 9 of 24 positions shown · non-contrast
Comparison: Previous exam(s).

CLINICAL DATA: Screening.

EXAM:
DIGITAL SCREENING BILATERAL MAMMOGRAM WITH TOMOSYNTHESIS AND CAD
TECHNIQUE: Bilateral screening digital craniocaudal and mediolateral oblique
mammograms were obtained. Bilateral screening digital breast
tomosynthesis was performed. The images were evaluated with
computer-aided detection.

[R CC synth-2D]
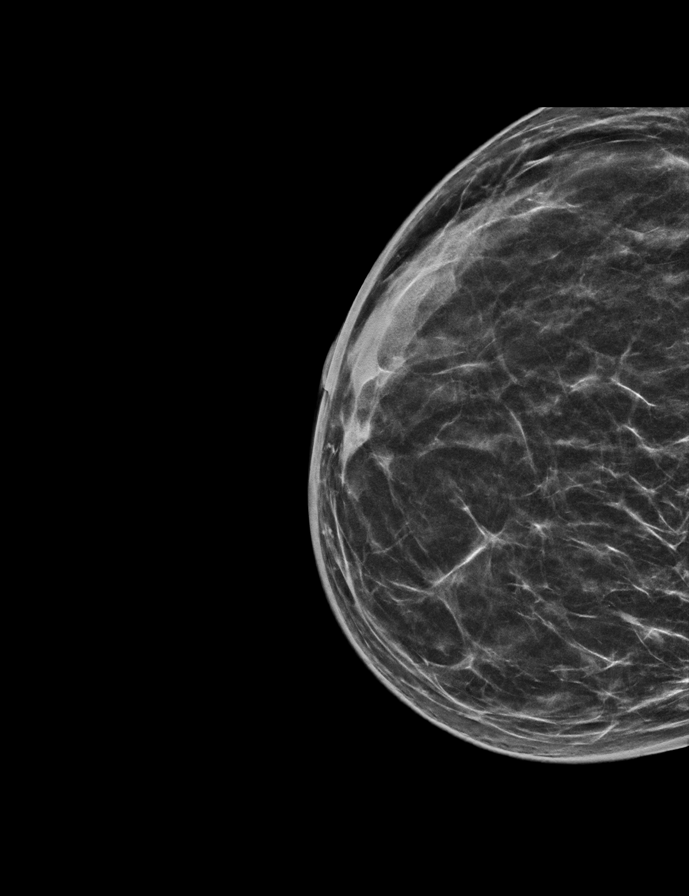

[L MLO synth-2D]
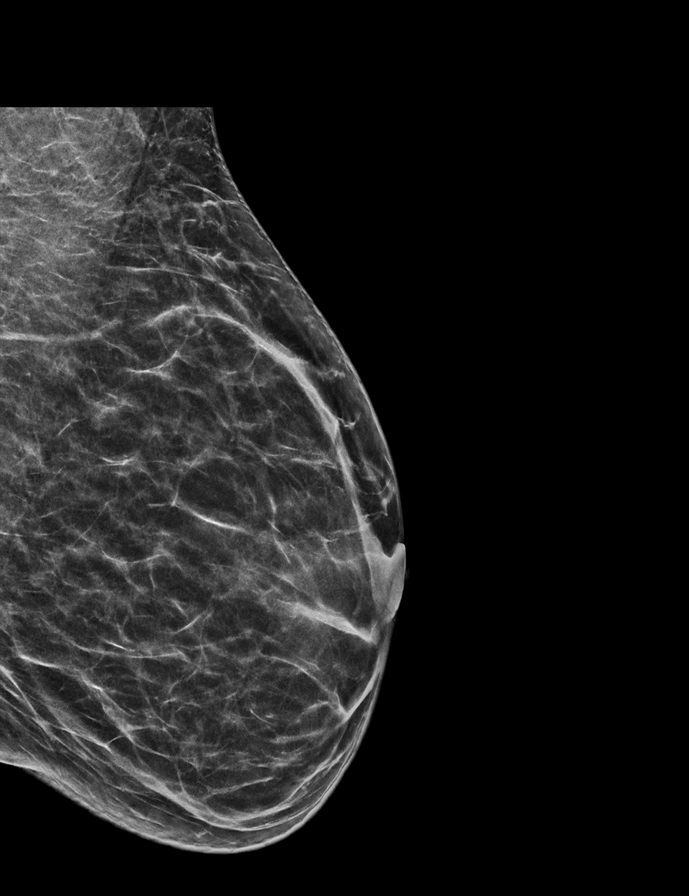

[R MLO synth-2D]
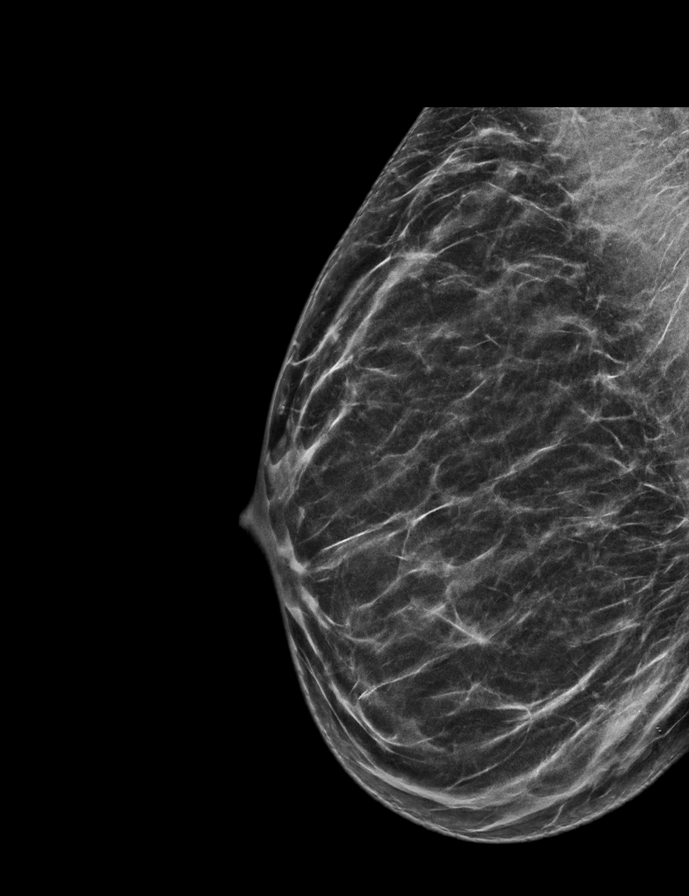

[L CC synth-2D]
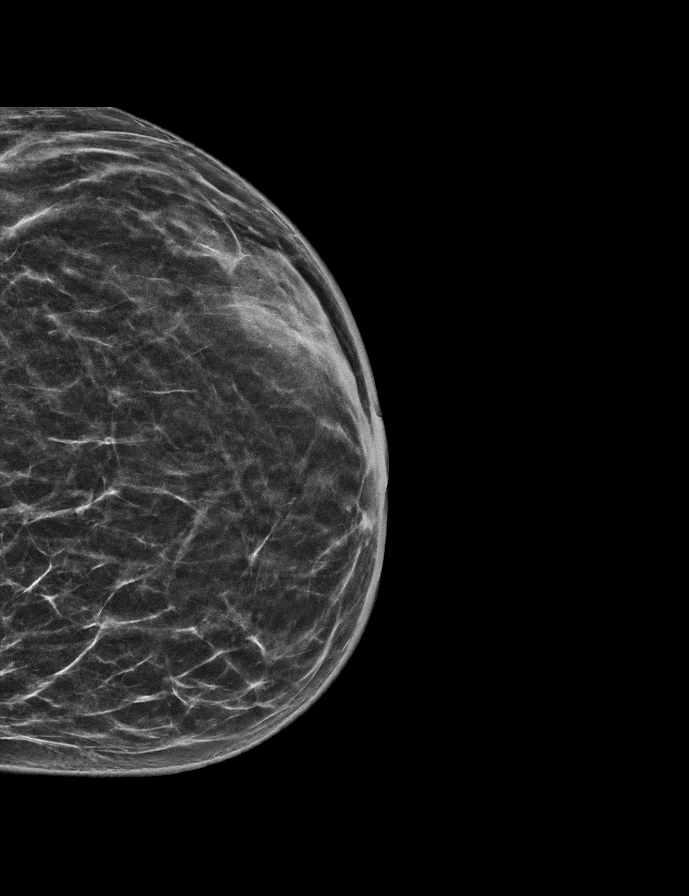

[R MLO tomo · 2 of 54 frames shown]
[frame 18/54]
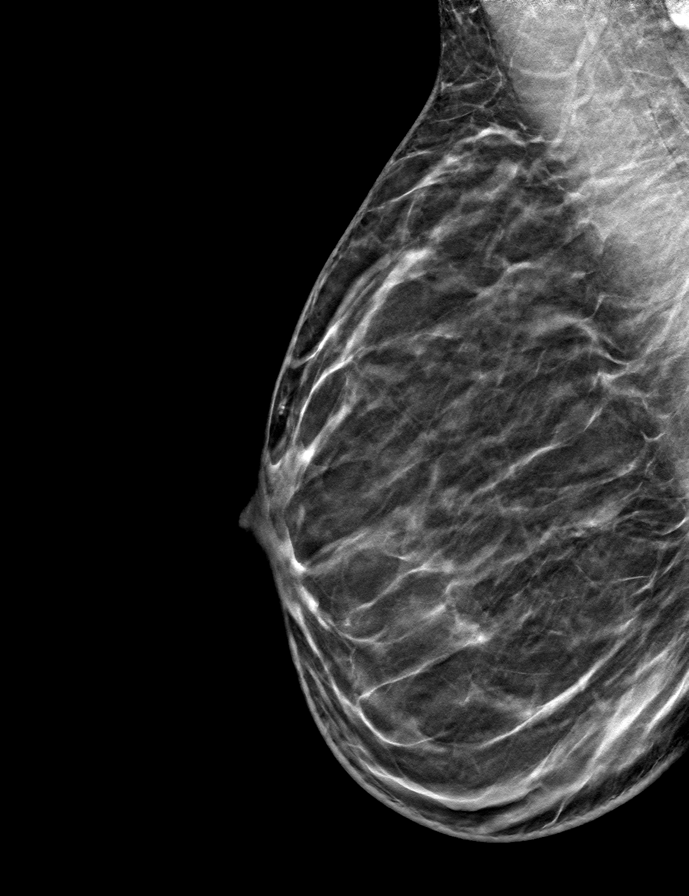
[frame 27/54]
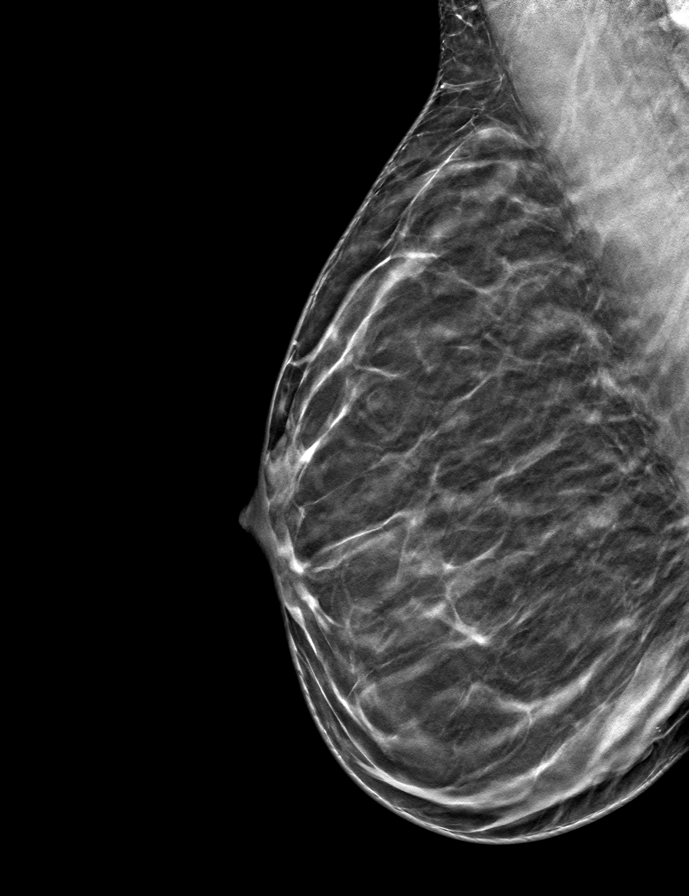

[L CC tomo · tomo slice 25/48.0]
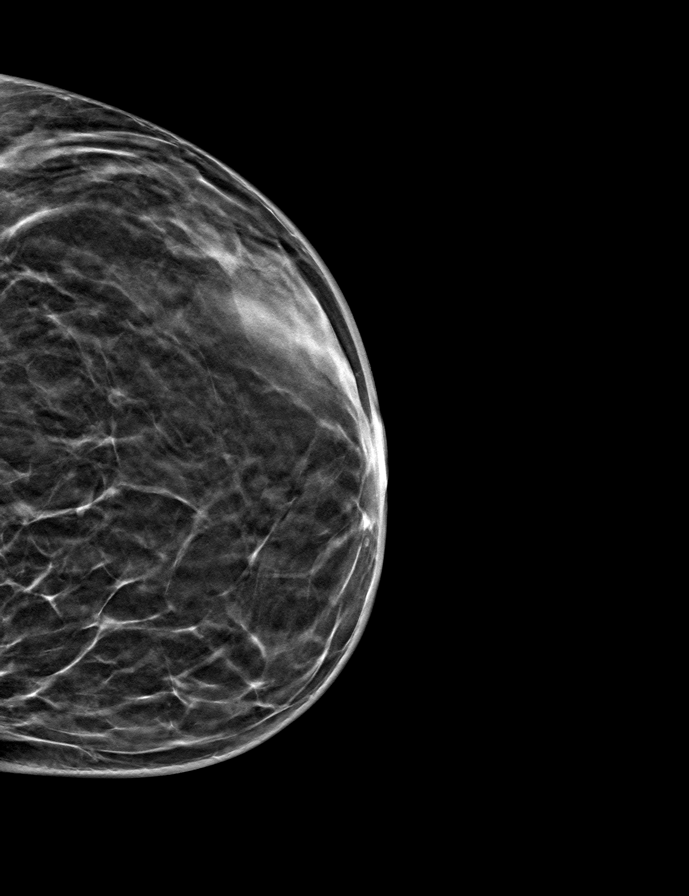

[L MLO tomo · tomo slice 27/54.0]
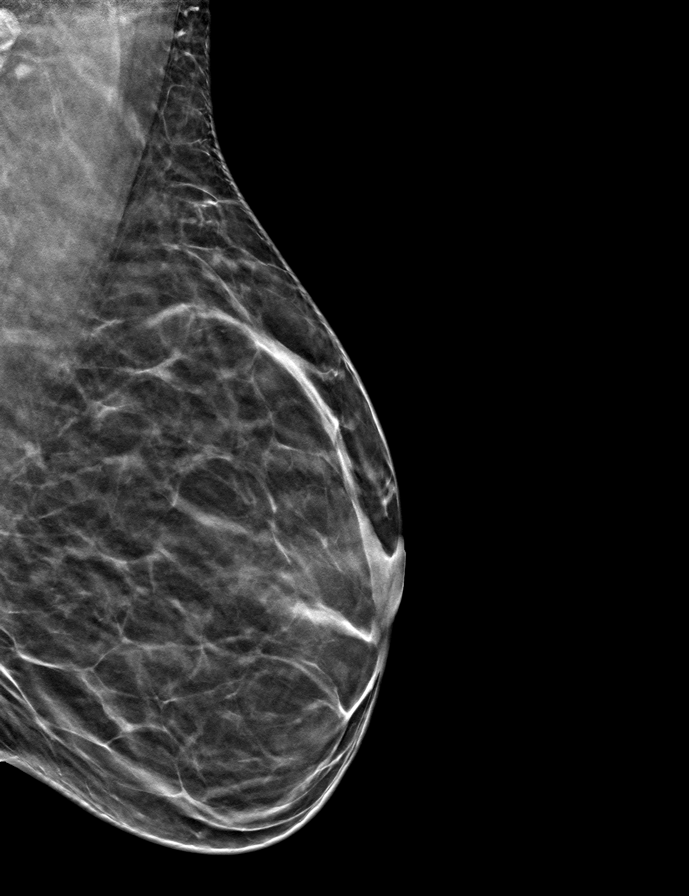

[R CC tomo · tomo slice 26/51.0]
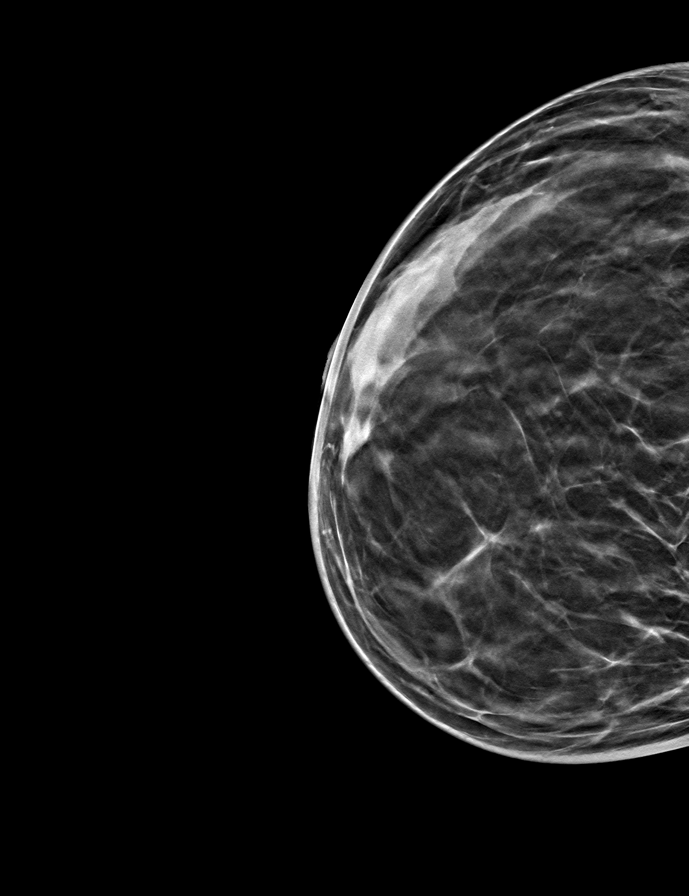

[9 of 24 positions shown; findings below may reference images not displayed]

ACR Breast Density Category b: There are scattered areas of
fibroglandular density.
FINDINGS: There are no findings suspicious for malignancy.
IMPRESSION: No mammographic evidence of malignancy. A result letter of this
screening mammogram will be mailed directly to the patient.

RECOMMENDATION:
Screening mammogram in one year. (Code:51-O-LD2)

BI-RADS CATEGORY  1: Negative.

## 2023-01-22 DIAGNOSIS — Z01419 Encounter for gynecological examination (general) (routine) without abnormal findings: Secondary | ICD-10-CM | POA: Diagnosis not present

## 2023-01-22 DIAGNOSIS — N898 Other specified noninflammatory disorders of vagina: Secondary | ICD-10-CM | POA: Diagnosis not present

## 2024-02-04 DIAGNOSIS — Z01419 Encounter for gynecological examination (general) (routine) without abnormal findings: Secondary | ICD-10-CM | POA: Diagnosis not present

## 2024-02-04 DIAGNOSIS — Z30431 Encounter for routine checking of intrauterine contraceptive device: Secondary | ICD-10-CM | POA: Diagnosis not present

## 2024-02-04 DIAGNOSIS — N898 Other specified noninflammatory disorders of vagina: Secondary | ICD-10-CM | POA: Diagnosis not present

## 2024-03-06 ENCOUNTER — Ambulatory Visit

## 2024-03-06 ENCOUNTER — Ambulatory Visit
Admission: RE | Admit: 2024-03-06 | Discharge: 2024-03-06 | Disposition: A | Discharge status: Home / Self Care | Source: Ambulatory Visit

## 2024-03-06 VITALS — BP 105/58 | HR 53 | Temp 97.6°F | Resp 18

## 2024-03-06 DIAGNOSIS — M79674 Pain in right toe(s): Secondary | ICD-10-CM

## 2024-03-06 DIAGNOSIS — S90111A Contusion of right great toe without damage to nail, initial encounter: Secondary | ICD-10-CM | POA: Diagnosis not present

## 2024-03-06 NOTE — Discharge Instructions (Signed)
 The radiologist did not see any fractures or concerns on the x-ray today.  You can continue to use Tylenol and ibuprofen  as needed for pain relief.  We recommend using a hard soled shoe that does not let the toe flex or extend while walking.  We recommend following up with your primary care provider if symptoms are not improving over the next 1 to 2 weeks.  Please return if you have any significant swelling, fever, pus drainage, or if you have any other concerns.

## 2024-03-06 NOTE — ED Triage Notes (Signed)
 Last Saturday she dropped a weight plate on her right big toe. Since then she has had bruising, swelling, and pain with weightbearing and movement.   Interventions: Ibuprofen  last week , Ice, Elevation

## 2024-03-06 NOTE — ED Provider Notes (Signed)
 Christine Escobar    CSN: [id number] Arrival date & time: 03/06/24  0817      History   Chief Complaint Chief Complaint  Patient presents with   Toe Injury    HPI Christine Escobar is a 48 y.o. female.   Patient is a 48 year old female who presents to the urgent care today with concerns of right great toe pain.  She reports that her symptoms started about a week ago when she accidentally dropped a weight plate on her toe.  Initially, it was pretty swollen and bruised.  The swelling and bruising have been improving over the past week but the pain has remained.  She reports that it is most painful with bearing weight.  She denies any pain with resting it.  She initially tried ibuprofen  and has iced it throughout the week.  She denies any loss of sensation, pain out of proportion, fever, drainage from the toe, or other concerns at this time.    Past Medical History:  Diagnosis Date   Celiac disease     There are no active problems to display for this patient.   Past Surgical History:  Procedure Laterality Date   NO PAST SURGERIES      OB History     Gravida  1   Para      Term      Preterm      AB      Living         SAB      IAB      Ectopic      Multiple      Live Births  1            Home Medications    Prior to Admission medications   Medication Sig Start Date End Date Taking? Authorizing Provider  Collagen-Vitamin C-Biotin (COLLAGEN PO) Take by mouth.   Yes [provider]  glucosamine-chondroitin 500-400 MG tablet Take 1 tablet by mouth daily. 05/14/15  Yes [provider]  Cholecalciferol (VITAMIN D3) 25 MCG (1000 UT) CAPS 1 capsule Orally Once a day for 30 day(s)    [provider]  Glucos-Chondroit-Collag-Hyal (GLUCOSAMINE CHONDROIT-COLLAGEN PO) Take by mouth.    [provider]  ibuprofen  (ADVIL ) 800 MG tablet Take 1 tablet (800 mg total) by mouth every 8 (eight) hours as needed for moderate pain. 10/28/19    Alvia Corean CROME, FNP  loratadine (CLARITIN) 10 MG tablet Take 10 mg by mouth daily.    [provider]  Multiple Vitamin (MULTIVITAMIN) capsule Take 1 capsule by mouth daily.    [provider]  Multiple Vitamins-Minerals (MULTIVITAMIN ADULT PO) Take by mouth.    [provider]  Probiotic Product (PROBIOTIC PO) Take by mouth.    [provider]  VITAMIN D PO Take by mouth.    [provider]    Family History Family History  Problem Relation Age of Onset   Hypertension Father     Social History Social History   Tobacco Use   Smoking status: Never    Passive exposure: Never   Smokeless tobacco: Never  Vaping Use   Vaping status: Never Used  Substance Use Topics   Alcohol use: Yes    Comment: rarely   Drug use: Never     Allergies   Barley grass, Rye grass flower pollen extract [gramineae pollens], and Wheat   Review of Systems Review of Systems See HPI for relevant ROS.  Physical Exam Triage  Vital Signs ED Triage Vitals  Encounter Vitals Group     BP 03/06/24 0831 (!) 105/58     Girls Systolic BP Percentile --      Girls Diastolic BP Percentile --      Boys Systolic BP Percentile --      Boys Diastolic BP Percentile --      Pulse Rate 03/06/24 0831 (!) 53     Resp 03/06/24 0831 18     Temp 03/06/24 0831 97.6 F (36.4 C)     Temp Source 03/06/24 0831 Oral     SpO2 03/06/24 0831 98 %     Weight --      Height --      Head Circumference --      Peak Flow --      Pain Score 03/06/24 0827 10     Pain Loc --      Pain Education --      Exclude from Growth Chart --    No data found.  Updated Vital Signs BP (!) 105/58 (BP Location: Right Arm)   Pulse (!) 53   Temp 97.6 F (36.4 C) (Oral)   Resp 18   SpO2 98%   Visual Acuity Right Eye Distance:   Left Eye Distance:   Bilateral Distance:    Right Eye Near:   Left Eye Near:    Bilateral Near:     Physical Exam General: Alert and oriented,  well-developed/well-nourished, calm, cooperative, no acute distress HEENT: Normocephalic atraumatic, moist mucous membranes, no scleral icterus, trachea midline Heart: Bradycardic, regular rhythm Lungs: Speaking full sentences, non-labored respirations, no distress Abdomen:  Soft, nondistended Musculoskeletal: Moves all extremities well, full range of motion of the right toes, pain to palpation of the dorsal part of the right great toe Pulses: 2+ pedal bilaterally, cap refill less than 2 seconds in the toes Neurologic: Awake, A&O x4, full sensation to light touch in the lower extremities Integumentary: Warm, dry, normal for ethnicity, mild bruising of the right great toe Psychiatric: Appropriate mood & affect  Escobar Treatments / Results  Labs (all labs ordered are listed, but only abnormal results are displayed) Labs Reviewed - No data to display  EKG   Radiology DG Foot Complete Right Result Date: 03/06/2024 CLINICAL DATA:  Injury to the right great toe from falling weight EXAM: RIGHT FOOT COMPLETE - 3 VIEW COMPARISON:  None Available. FINDINGS: There is no evidence of fracture or dislocation. There is no evidence of arthropathy or other focal bone abnormality. Soft tissues are unremarkable. IMPRESSION: No acute fracture or dislocation. Electronically Signed   By: Limin  Xu M.D.   On: 03/06/2024 08:53    Procedures Procedures (including critical care time)  Medications Ordered in Escobar Medications - No data to display  Initial Impression / Assessment and Plan / Escobar Course  I have reviewed the triage vital signs and the nursing notes.  Pertinent labs & imaging results that were available during my care of the patient were reviewed by me and considered in my medical decision making (see chart for details).    Presents with right toe pain.  Differential diagnosis includes: Sprain, fracture, dislocation, contusion, hematoma, septic arthritis, gout, paronychia, felon, including other  diagnoses.  History obtained from: Patient.  Plan at this time/rationale: X-ray of the right foot.  All ordered tests including imaging and labs were independently reviewed and interpreted by myself. Notable findings: No fractures noted.  Plan: No obvious dislocation of the toe.  Patient is  neurovascularly intact.  Full sensation to light touch.  X-ray of the right foot was obtained and radiologist reports no acute fracture or dislocation. Discussed RICE measures (rest, ice, compression, and elevation).  Patient reports that she already has a postop shoe at home that she can use. Recommended ibuprofen  and Tylenol for pain relief as needed. We discussed return precautions, symptomatic treatment, and follow up with primary care doctor within 1 week as needed for further evaluation, and the patient demonstrated understanding and agreement with this plan.   Disposition: Stable to discharge home.   All questions answered to the best of this examiner's ability. Advised to f/u with PCP for further eval and/or reassessment. Patient agrees to plan.  An appropriate evaluation has been performed, and in my medical judgment there is currently no evidence of an immediate life-threatening or surgical condition. Discharge is therefore indicated at this time.  This document was created using the aid of voice recognition Scientist, clinical (histocompatibility and immunogenetics).  Final Clinical Impressions(s) / Escobar Diagnoses   Final diagnoses:  Toe pain, right     Discharge Instructions      The radiologist did not see any fractures or concerns on the x-ray today.  You can continue to use Tylenol and ibuprofen  as needed for pain relief.  We recommend using a hard soled shoe that does not let the toe flex or extend while walking.  We recommend following up with your primary care provider if symptoms are not improving over the next 1 to 2 weeks.  Please return if you have any significant swelling, fever, pus drainage, or if you have any  other concerns.     ED Prescriptions   None    PDMP not reviewed this encounter.   Melonie Locus, PA-C 03/06/24 [phone]
